# Patient Record
Sex: Male | Born: 1995 | Race: White | Hispanic: No | Marital: Single | Smoking: Current every day smoker
Health system: Southern US, Community
[De-identification: ages and names within clinical notes are randomized; demographics above are authoritative.]

## PROBLEM LIST (undated history)

## (undated) HISTORY — PX: BOWEL RESECTION: SHX1257

---

## 2015-02-24 DIAGNOSIS — R197 Diarrhea, unspecified: Secondary | ICD-10-CM | POA: Insufficient documentation

## 2015-02-24 DIAGNOSIS — K8020 Calculus of gallbladder without cholecystitis without obstruction: Secondary | ICD-10-CM | POA: Diagnosis not present

## 2015-02-24 DIAGNOSIS — F17200 Nicotine dependence, unspecified, uncomplicated: Secondary | ICD-10-CM | POA: Diagnosis not present

## 2015-02-24 DIAGNOSIS — R1031 Right lower quadrant pain: Secondary | ICD-10-CM | POA: Diagnosis present

## 2015-02-24 LAB — CBC WITH DIFFERENTIAL/PLATELET
BASOS ABS: 0.1 10*3/uL (ref 0–0.1)
Basophils Relative: 1 %
EOS ABS: 0.1 10*3/uL (ref 0–0.7)
EOS PCT: 2 %
HCT: 53.3 % — ABNORMAL HIGH (ref 40.0–52.0)
Hemoglobin: 18.5 g/dL — ABNORMAL HIGH (ref 13.0–18.0)
LYMPHS PCT: 24 %
Lymphs Abs: 1.7 10*3/uL (ref 1.0–3.6)
MCH: 29.5 pg (ref 26.0–34.0)
MCHC: 34.7 g/dL (ref 32.0–36.0)
MCV: 84.8 fL (ref 80.0–100.0)
MONO ABS: 0.7 10*3/uL (ref 0.2–1.0)
Monocytes Relative: 9 %
Neutro Abs: 4.6 10*3/uL (ref 1.4–6.5)
Neutrophils Relative %: 64 %
PLATELETS: 225 10*3/uL (ref 150–440)
RBC: 6.28 MIL/uL — ABNORMAL HIGH (ref 4.40–5.90)
RDW: 13.1 % (ref 11.5–14.5)
WBC: 7.2 10*3/uL (ref 3.8–10.6)

## 2015-02-24 LAB — COMPREHENSIVE METABOLIC PANEL
ALBUMIN: 4.6 g/dL (ref 3.5–5.0)
ALK PHOS: 72 U/L (ref 38–126)
ALT: 54 U/L (ref 17–63)
AST: 38 U/L (ref 15–41)
Anion gap: 8 (ref 5–15)
BUN: 8 mg/dL (ref 6–20)
CO2: 28 mmol/L (ref 22–32)
Calcium: 9.6 mg/dL (ref 8.9–10.3)
Chloride: 102 mmol/L (ref 101–111)
Creatinine, Ser: 0.95 mg/dL (ref 0.61–1.24)
GFR calc Af Amer: >60 mL/min (ref >60)
GFR calc non Af Amer: >60 mL/min (ref >60)
Glucose, Bld: 105 mg/dL — ABNORMAL HIGH (ref 65–99)
Potassium: 3.8 mmol/L (ref 3.5–5.1)
SODIUM: 138 mmol/L (ref 135–145)
Total Bilirubin: 1 mg/dL (ref 0.3–1.2)
Total Protein: 8 g/dL (ref 6.5–8.1)

## 2015-02-24 LAB — URINALYSIS COMPLETE WITH MICROSCOPIC (ARMC ONLY)
BACTERIA UA: NONE SEEN
Bilirubin Urine: NEGATIVE
GLUCOSE, UA: NEGATIVE mg/dL
Hgb urine dipstick: NEGATIVE
Ketones, ur: NEGATIVE mg/dL
Leukocytes, UA: NEGATIVE
NITRITE: NEGATIVE
Protein, ur: NEGATIVE mg/dL
Specific Gravity, Urine: 1.014 (ref 1.005–1.030)
pH: 7 (ref 5.0–8.0)

## 2015-02-24 LAB — LIPASE, BLOOD: Lipase: 14 U/L (ref 11–51)

## 2015-02-24 NOTE — ED Notes (Signed)
Pt arrived to ED with c/o RLQ pain x 1 day. Pt reports blood in stool x 1 last night. Pt reports diarrhea x 1 month.

## 2015-02-25 ENCOUNTER — Emergency Department
Admission: EM | Admit: 2015-02-25 | Discharge: 2015-02-25 | Disposition: A | Discharge status: Home / Self Care | Payer: BLUE CROSS/BLUE SHIELD | Attending: Emergency Medicine | Admitting: Emergency Medicine

## 2015-02-25 ENCOUNTER — Emergency Department: Payer: BLUE CROSS/BLUE SHIELD

## 2015-02-25 ENCOUNTER — Encounter: Payer: Self-pay | Admitting: Radiology

## 2015-02-25 DIAGNOSIS — K8020 Calculus of gallbladder without cholecystitis without obstruction: Secondary | ICD-10-CM

## 2015-02-25 MED ORDER — ONDANSETRON 4 MG PO TBDP
4.0000 mg | ORAL_TABLET | Freq: Three times a day (TID) | ORAL | Status: DC | PRN
Start: 2015-02-25 — End: 2015-03-04

## 2015-02-25 MED ORDER — OXYCODONE-ACETAMINOPHEN 5-325 MG PO TABS: 1.0000 | ORAL_TABLET | ORAL | Status: DC | PRN

## 2015-02-25 MED ORDER — IOHEXOL 300 MG/ML  SOLN
100.0000 mL | Freq: Once | INTRAMUSCULAR | Status: AC | PRN
  Administered 2015-02-25: 100 mL via INTRAVENOUS

## 2015-02-25 MED ORDER — IOHEXOL 240 MG/ML SOLN
25.0000 mL | Freq: Once | INTRAMUSCULAR | Status: AC | PRN
  Administered 2015-02-25: 25 mL via ORAL

## 2015-02-25 NOTE — ED Provider Notes (Signed)
[facility] Emergency Department Provider Note  ____________________________________________  Time seen: 1:15 AM  I have reviewed the triage vital signs and the nursing notes.   HISTORY  Chief Complaint Abdominal Pain     HPI Christopher Tate is a 20 y.o. male presents with right lower quadrant pain currently 6 out of 10 times one day accompanied by bloody stools. Patient admits to diarrhea times one month however states that the diarrhea became bloody last night. Patient denies any fever no vomiting. She denies any family history of Crohn's disease or ulcerative colitis.      There are no active problems to display for this patient.   Past Surgical History  Procedure Laterality Date  . Bowel resection  1995    No current outpatient prescriptions on file.  Allergies No known drug allergies History reviewed. No pertinent family history.  Social History Social History  Substance Use Topics  . Smoking status: Current Every Day Smoker  . Smokeless tobacco: None  . Alcohol Use: No    Review of Systems  Constitutional: Negative for fever. Eyes: Negative for visual changes. ENT: Negative for sore throat. Cardiovascular: Negative for chest pain. Respiratory: Negative for shortness of breath. Gastrointestinal: positive for abdominal pain and diarrhea Genitourinary: Negative for dysuria. Musculoskeletal: Negative for back pain. Skin: Negative for rash. Neurological: Negative for headaches, focal weakness or numbness.  10-point ROS otherwise negative.  ____________________________________________   PHYSICAL EXAM:  VITAL SIGNS: ED Triage Vitals  Enc Vitals Group     BP 02/24/15 2000 141/69 mmHg     Pulse Rate 02/24/15 2000 74     Resp 02/24/15 2000 16     Temp 02/24/15 2000 98 F (36.7 C)     Temp Source 02/24/15 2000 Oral     SpO2 02/24/15 2000 97 %     Weight 02/24/15 2000 150 lb (68.04 kg)     Height 02/24/15 2000 5\' 9"   (1.753 m)     Head Cir --      Peak Flow --      Pain Score 02/24/15 2002 4     Pain Loc --      Pain Edu? --      Excl. in GC? --      Constitutional: Alert and oriented. Well appearing and in no distress. Eyes: Conjunctivae are normal. PERRL. Normal extraocular movements. ENT   Head: Normocephalic and atraumatic.   Nose: No congestion/rhinnorhea.   Mouth/Throat: Mucous membranes are moist.   Neck: No stridor. Hematological/Lymphatic/Immunilogical: No cervical lymphadenopathy. Cardiovascular: Normal rate, regular rhythm. Normal and symmetric distal pulses are present in all extremities. No murmurs, rubs, or gallops. Respiratory: Normal respiratory effort without tachypnea nor retractions. Breath sounds are clear and equal bilaterally. No wheezes/rales/rhonchi. Gastrointestinal: Right Lower quadrant tenderness to palpation No distention. There is no CVA tenderness. Genitourinary: deferred Musculoskeletal: Nontender with normal range of motion in all extremities. No joint effusions.  No lower extremity tenderness nor edema. Neurologic:  Normal speech and language. No gross focal neurologic deficits are appreciated. Speech is normal.  Skin:  Skin is warm, dry and intact. No rash noted. Psychiatric: Mood and affect are normal. Speech and behavior are normal. Patient exhibits appropriate insight and judgment.  ____________________________________________    LABS (pertinent positives/negatives)  Labs Reviewed  CBC WITH DIFFERENTIAL/PLATELET - Abnormal; Notable for the following:    RBC 6.28 (*)    Hemoglobin 18.5 (*)    HCT 53.3 (*)    All other components within  normal limits  URINALYSIS COMPLETEWITH MICROSCOPIC ([facility] ONLY) - Abnormal; Notable for the following:    Color, Urine YELLOW (*)    APPearance CLEAR (*)    Squamous Epithelial / LPF 0-5 (*)    All other components within normal limits  COMPREHENSIVE METABOLIC PANEL - Abnormal; Notable for the following:     Glucose, Bld 105 (*)    All other components within normal limits  LIPASE, BLOOD       RADIOLOGY CT Abdomen Pelvis W Contrast (Final result) Result time: 02/25/15 02:44:34   Final result by Rad Results In Interface (02/25/15 02:44:34)   Narrative:   CLINICAL DATA: Acute onset of right lower quadrant abdominal pain and hematochezia. Chronic diarrhea. Initial encounter.  EXAM: CT ABDOMEN AND PELVIS WITH CONTRAST  TECHNIQUE: Multidetector CT imaging of the abdomen and pelvis was performed using the standard protocol following bolus administration of intravenous contrast.  CONTRAST: 100mL OMNIPAQUE IOHEXOL 300 MG/ML SOLN  COMPARISON: None.  FINDINGS: Minimal left basilar scarring is noted.  The liver and spleen are unremarkable in appearance. A few stones are seen dependently within the gallbladder. The gallbladder is otherwise unremarkable. The pancreas and adrenal glands are unremarkable.  The kidneys are unremarkable in appearance. There is no evidence of hydronephrosis. No renal or ureteral stones are seen. No perinephric stranding is appreciated.  No free fluid is identified. The small bowel is unremarkable in appearance. The stomach is within normal limits. No acute vascular abnormalities are seen.  The appendix is normal in caliber, without evidence of appendicitis. The cecum is flipped anteriorly. The colon is grossly unremarkable in appearance.  The bladder is mildly distended and grossly unremarkable. The prostate remains normal in size. No inguinal lymphadenopathy is seen.  No acute osseous abnormalities are identified.  IMPRESSION: 1. No acute abnormality seen within the abdomen or pelvis. 2. Cholelithiasis; gallbladder otherwise unremarkable.   Electronically Signed By: Roanna RaiderJeffery Chang M.D. On: 02/25/2015 02:44            INITIAL IMPRESSION / ASSESSMENT AND PLAN / ED COURSE  Pertinent labs & imaging results that were  available during my care of the patient were reviewed by me and considered in my medical decision making (see chart for details).  Patient form the CT scan findings of gallstones referred to Dr. Tonita CongWoodham general surgeon on-call for further outpatient management  ____________________________________________   FINAL CLINICAL IMPRESSION(S) / ED DIAGNOSES  Final diagnoses:  Gallstones      Darci Currentandolph N Axelle Szwed, MD 02/25/15 [phone]

## 2015-02-25 NOTE — Discharge Instructions (Signed)

## 2015-03-04 ENCOUNTER — Ambulatory Visit (INDEPENDENT_AMBULATORY_CARE_PROVIDER_SITE_OTHER): Payer: BLUE CROSS/BLUE SHIELD | Admitting: General Surgery

## 2015-03-04 ENCOUNTER — Encounter: Payer: Self-pay | Admitting: General Surgery

## 2015-03-04 VITALS — BP 123/81 | HR 80 | Temp 98.1°F | Ht 69.0 in | Wt 149.0 lb

## 2015-03-04 DIAGNOSIS — R1031 Right lower quadrant pain: Secondary | ICD-10-CM | POA: Diagnosis not present

## 2015-03-04 DIAGNOSIS — R109 Unspecified abdominal pain: Secondary | ICD-10-CM | POA: Insufficient documentation

## 2015-03-04 NOTE — Patient Instructions (Signed)
We will refer you to see Dr. Manus GunningWohl-Gastroenterologist.

## 2015-03-04 NOTE — Progress Notes (Signed)
Patient ID: Christopher Tate, male   DOB: 1995/05/20, 20 y.o.   MRN: [id number]  CC: ABDOMINAL PAIN  HPI Christopher Tate is a 20 y.o. male presents to clinic for follow-up from recent ER visit for abdominal pain. Patient states that for the last week he isn't having right lower quadrant abdominal pain. The pain is constant. Associated with this he is also had diarrhea. There is been no food association with his pain. There is been no change in his pain with changes in his diet. He is having 5-6 bowel movements per day. Patient states this is normal for him. 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 Patient is otherwise in his usual state of health. He denies any fevers, chills, nausea, vomiting, chest pain, shortness of breath, malaise.  HPI  History reviewed. No pertinent past medical history.  Past Surgical History  Procedure Laterality Date  . Bowel resection  1995    Family History  Problem Relation Age of Onset  . Lupus Mother   . Hypertension Mother   . Hypertension Father   . Dementia Paternal Grandmother   . Cancer Paternal Grandfather     lung    Social History Social History  Substance Use Topics  . Smoking status: Current Every Day Smoker  . Smokeless tobacco: None  . Alcohol Use: No    No Known Allergies  No current outpatient prescriptions on file.   No current facility-administered medications for this visit.     Review of Systems A multi-point review of systems was asked and was negative except for the findings documented in the history of present illness  Physical Exam Blood pressure 123/81, pulse 80, temperature 98.1 F (36.7 C), temperature source Oral, height 5\' 9"  (1.753 m), weight 67.586 kg (149 lb). CONSTITUTIONAL: No acute distress. EYES: Pupils are equal, round, and reactive to light, Sclera are non-icteric. EARS, NOSE, MOUTH AND THROAT: The oropharynx is clear. The oral mucosa is pink  and moist. Hearing is intact to voice. LYMPH NODES:  Lymph nodes in the neck are normal. RESPIRATORY:  Lungs are clear. There is normal respiratory effort, with equal breath sounds bilaterally, and without pathologic use of accessory muscles. CARDIOVASCULAR: Heart is regular without murmurs, gallops, or rubs. GI: The abdomen is then, soft, minimally tender to deep palpation in the right lower quadrant and the periumbilical region, and nondistended. There is a well-healed transverse incision in his upper abdomen. There are no palpable masses. There is no hepatosplenomegaly. There are normal bowel sounds in all quadrants. GU: Rectal deferred.   MUSCULOSKELETAL: Normal muscle strength and tone. No cyanosis or edema.   SKIN: Turgor is good and there are no pathologic skin lesions or ulcers. NEUROLOGIC: Motor and sensation is grossly normal. Cranial nerves are grossly intact. PSYCH:  Oriented to person, place and time. Affect is normal.  Data Reviewed Images and labs reviewed personally. CT scan does show cholelithiasis but no evidence of cholecystitis. Labs are all within normal limits. I have personally reviewed the patient's imaging, laboratory findings and medical records.    Assessment    Abdominal pain with chronic diarrhea    Plan    20 year old male with abdominal pain and chronic diarrhea. This is likely secondary to his neonatal surgery. There is no clinical findings suggestive of cholecystitis. His pain is most likely secondary to some other gastroenterologic process. Patient may benefit from a colonoscopy, but will definitely benefit from a GI workup. Plan  to refer to Dr. wall for further workup of his abdominal pain and chronic diarrhea.     Time spent with the patient was 30 minutes, with more than 50% of the time spent in face-to-face education, counseling and care coordination.     Ricarda Frameharles Makena Mcgrady, MD FACS General Surgeon 03/04/2015, 8:57 AM

## 2015-03-06 ENCOUNTER — Ambulatory Visit: Payer: Self-pay | Admitting: General Surgery

## 2015-04-02 ENCOUNTER — Ambulatory Visit: Payer: Self-pay | Admitting: Gastroenterology

## 2017-05-08 IMAGING — CT CT ABD-PELV W/ CM
2 of 4 series · 16 of 46 positions shown, 18 images · IV contrast (omnipaque)
Comparison: None.

CLINICAL DATA: Acute onset of right lower quadrant abdominal pain
and hematochezia. Chronic diarrhea. Initial encounter.

EXAM:
CT ABDOMEN AND PELVIS WITH CONTRAST
TECHNIQUE: Multidetector CT imaging of the abdomen and pelvis was performed
using the standard protocol following bolus administration of
intravenous contrast.
CONTRAST:  100mL OMNIPAQUE IOHEXOL 300 MG/ML  SOLN

[Series 2: routine abd pel with · axial · 0.66mm/px · z∈[-850,-420]mm · 13 of 94 slices shown, 15 images]
[im 4/94  soft-tissue]
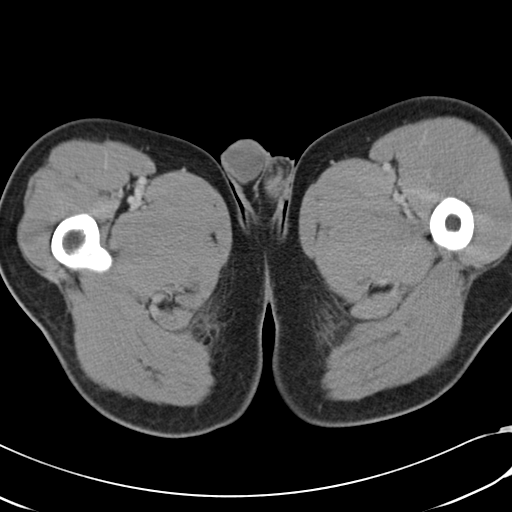
[im 4/94  bone]
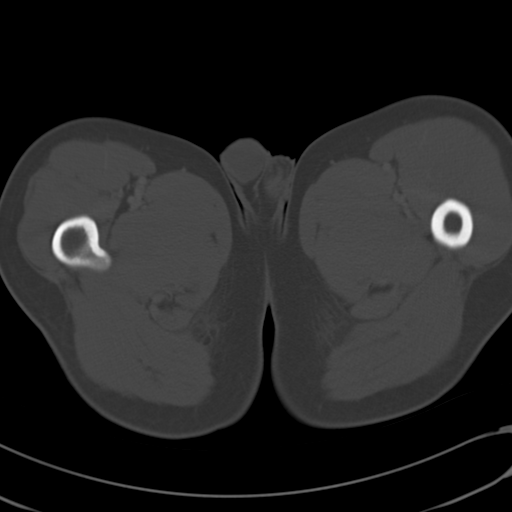
[im 12/94  soft-tissue]
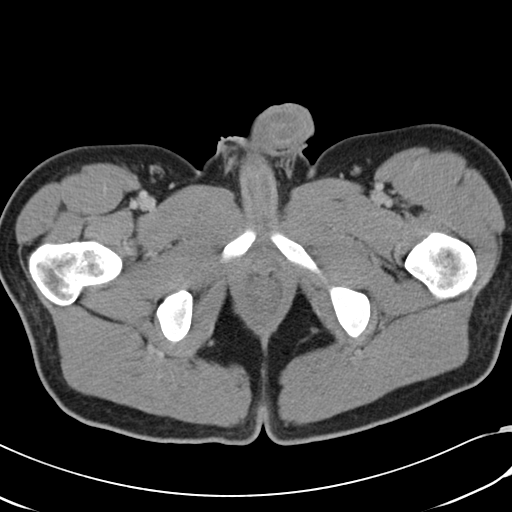
[im 19/94  soft-tissue]
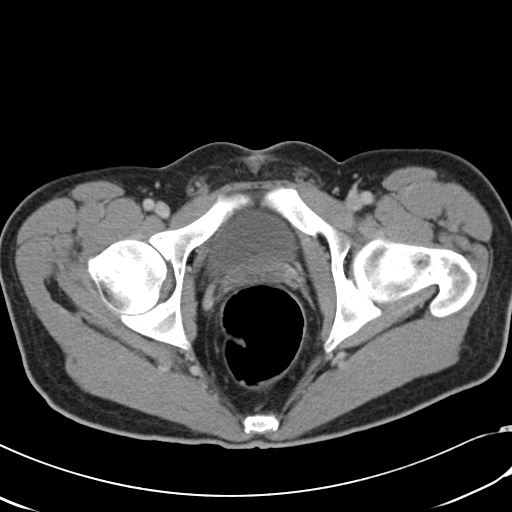
[im 27/94  soft-tissue]
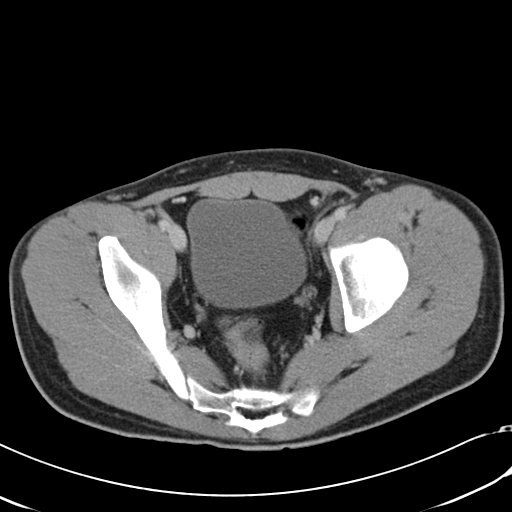
[im 34/94  soft-tissue]
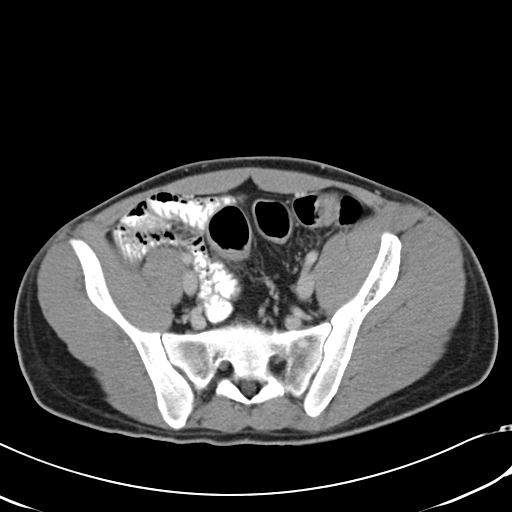
[im 41/94  soft-tissue]
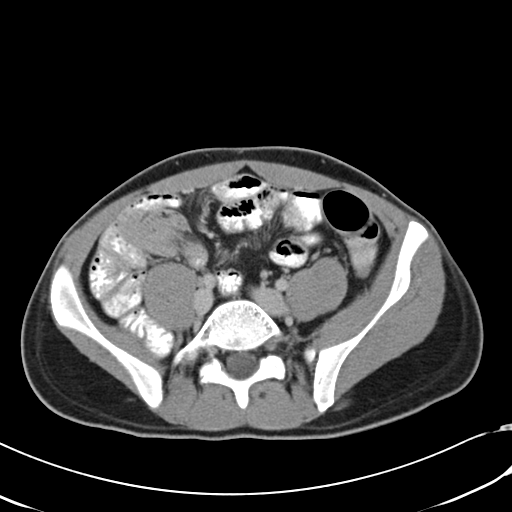
[im 49/94  soft-tissue]
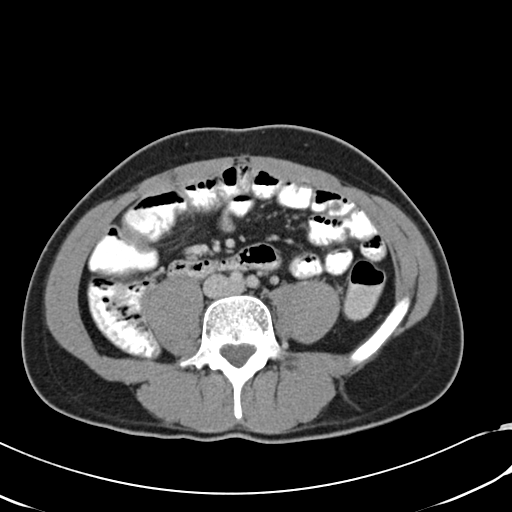
[im 53/94  soft-tissue]
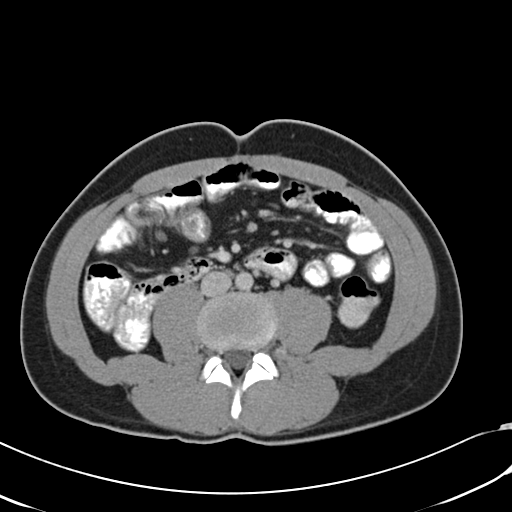
[im 60/94  soft-tissue]
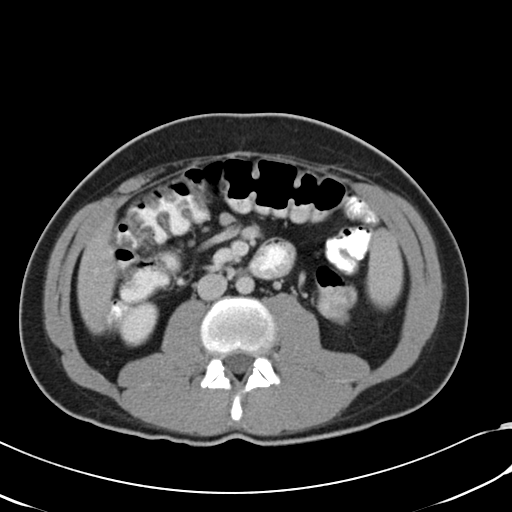
[im 60/94  bone]
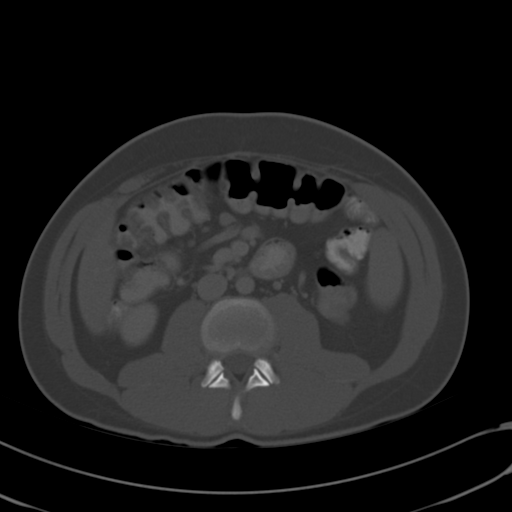
[im 67/94  soft-tissue]
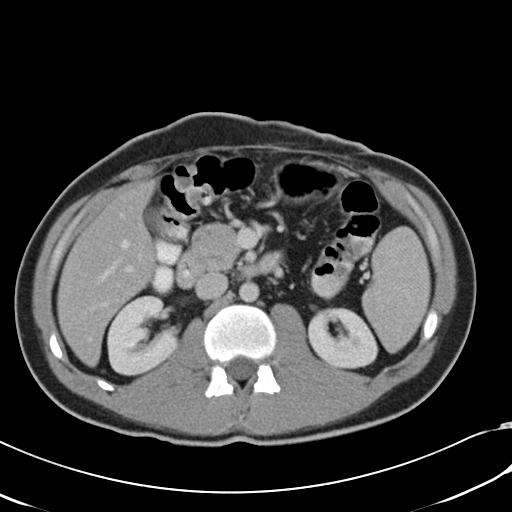
[im 75/94  soft-tissue]
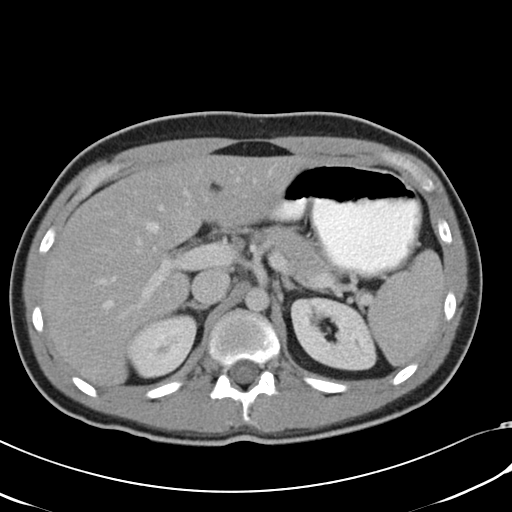
[im 82/94  soft-tissue]
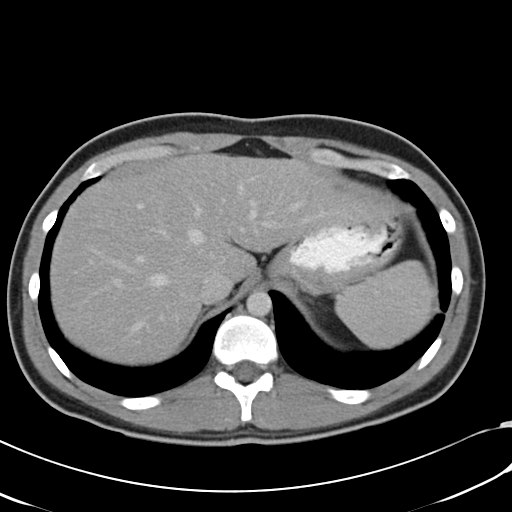
[im 90/94  soft-tissue]
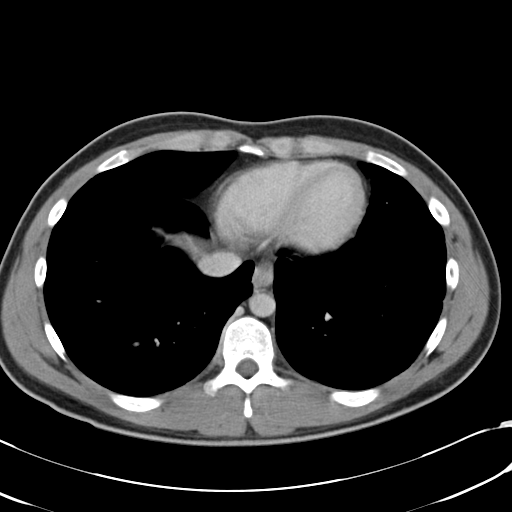

[Series 5: cor routine abd pel with · coronal · 0.67mm/px · 3 of 111 slices shown]
[im 37/111  soft-tissue]
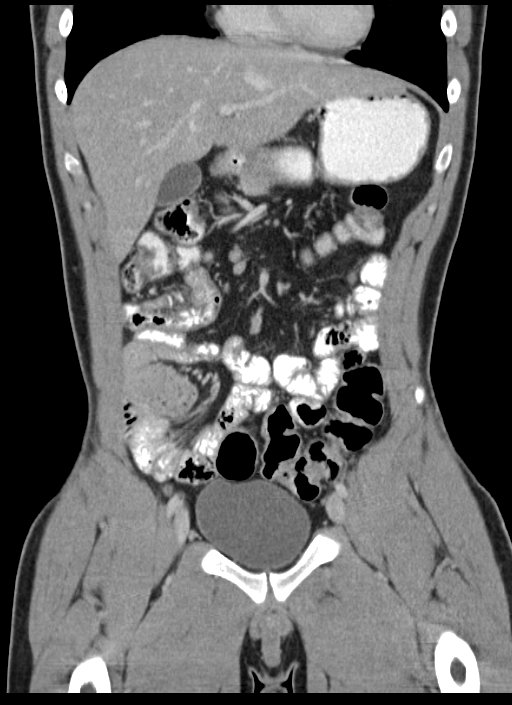
[im 49/111  soft-tissue]
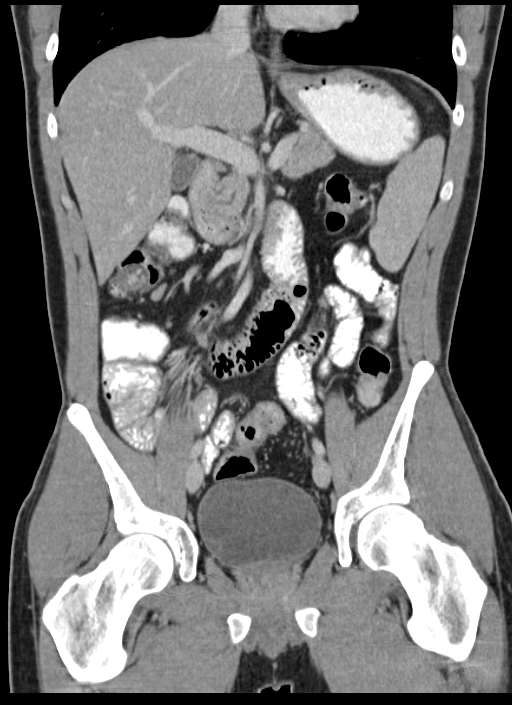
[im 62/111  soft-tissue]
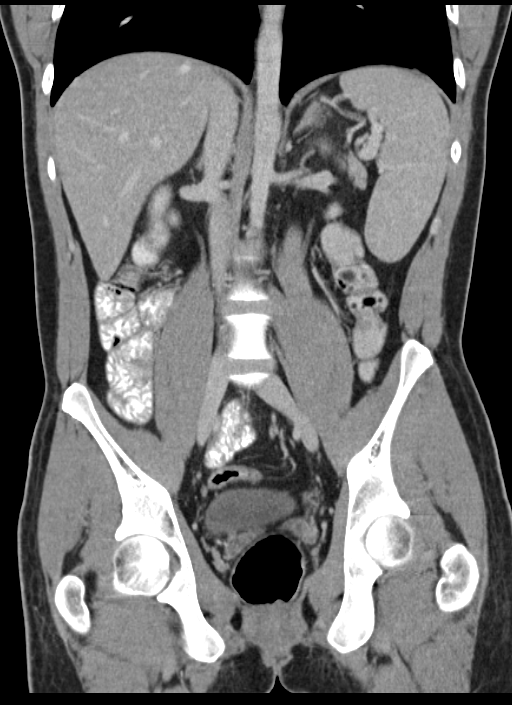

[16 of 46 positions shown; findings below may reference images not displayed]

FINDINGS: Minimal left basilar scarring is noted.

The liver and spleen are unremarkable in appearance. A few stones
are seen dependently within the gallbladder. The gallbladder is
otherwise unremarkable. The pancreas and adrenal glands are
unremarkable.

The kidneys are unremarkable in appearance. There is no evidence of
hydronephrosis. No renal or ureteral stones are seen. No perinephric
stranding is appreciated.

No free fluid is identified. The small bowel is unremarkable in
appearance. The stomach is within normal limits. No acute vascular
abnormalities are seen.

The appendix is normal in caliber, without evidence of appendicitis.
The cecum is flipped anteriorly. The colon is grossly unremarkable
in appearance.

The bladder is mildly distended and grossly unremarkable. The
prostate remains normal in size. No inguinal lymphadenopathy is
seen.

No acute osseous abnormalities are identified.
IMPRESSION: 1. No acute abnormality seen within the abdomen or pelvis.
2. Cholelithiasis; gallbladder otherwise unremarkable.
